# Patient Record
Sex: Male | Born: 1995 | Race: White | Hispanic: No | Marital: Single | Smoking: Current every day smoker
Health system: Southern US, Community
[De-identification: ages and names within clinical notes are randomized; demographics above are authoritative.]

## PROBLEM LIST (undated history)

## (undated) DIAGNOSIS — F909 Attention-deficit hyperactivity disorder, unspecified type: Secondary | ICD-10-CM

## (undated) DIAGNOSIS — F32A Depression, unspecified: Secondary | ICD-10-CM

## (undated) DIAGNOSIS — F419 Anxiety disorder, unspecified: Secondary | ICD-10-CM

## (undated) DIAGNOSIS — G2581 Restless legs syndrome: Secondary | ICD-10-CM

## (undated) DIAGNOSIS — F329 Major depressive disorder, single episode, unspecified: Secondary | ICD-10-CM

## (undated) HISTORY — PX: TESTICLE SURGERY: SHX794

## (undated) HISTORY — PX: REPLACEMENT TOTAL KNEE: SUR1224

---
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
## 2014-04-26 ENCOUNTER — Encounter: Payer: Self-pay | Admitting: Family Medicine

## 2017-08-24 ENCOUNTER — Other Ambulatory Visit: Payer: Self-pay

## 2017-08-24 ENCOUNTER — Encounter (HOSPITAL_COMMUNITY): Payer: Self-pay | Admitting: Emergency Medicine

## 2017-08-24 ENCOUNTER — Inpatient Hospital Stay (HOSPITAL_COMMUNITY)
Admission: AD | Admit: 2017-08-24 | Discharge: 2017-08-27 | DRG: 881 | Disposition: A | Discharge status: Home / Self Care | Payer: Federal, State, Local not specified - Other | Source: Intra-hospital | Attending: Psychiatry | Admitting: Psychiatry

## 2017-08-24 ENCOUNTER — Emergency Department (HOSPITAL_COMMUNITY)
Admission: EM | Admit: 2017-08-24 | Discharge: 2017-08-24 | Disposition: A | Discharge status: Other Acute Inpatient Hospital | Payer: Self-pay | Attending: Emergency Medicine | Admitting: Emergency Medicine

## 2017-08-24 ENCOUNTER — Encounter (HOSPITAL_COMMUNITY): Payer: Self-pay | Admitting: *Deleted

## 2017-08-24 DIAGNOSIS — F329 Major depressive disorder, single episode, unspecified: Secondary | ICD-10-CM | POA: Insufficient documentation

## 2017-08-24 DIAGNOSIS — F322 Major depressive disorder, single episode, severe without psychotic features: Secondary | ICD-10-CM | POA: Diagnosis not present

## 2017-08-24 DIAGNOSIS — F1310 Sedative, hypnotic or anxiolytic abuse, uncomplicated: Secondary | ICD-10-CM | POA: Diagnosis present

## 2017-08-24 DIAGNOSIS — R45851 Suicidal ideations: Secondary | ICD-10-CM | POA: Insufficient documentation

## 2017-08-24 DIAGNOSIS — Z046 Encounter for general psychiatric examination, requested by authority: Secondary | ICD-10-CM | POA: Insufficient documentation

## 2017-08-24 DIAGNOSIS — F17210 Nicotine dependence, cigarettes, uncomplicated: Secondary | ICD-10-CM | POA: Diagnosis present

## 2017-08-24 DIAGNOSIS — F1120 Opioid dependence, uncomplicated: Secondary | ICD-10-CM | POA: Diagnosis present

## 2017-08-24 DIAGNOSIS — G4700 Insomnia, unspecified: Secondary | ICD-10-CM | POA: Diagnosis present

## 2017-08-24 DIAGNOSIS — Z96659 Presence of unspecified artificial knee joint: Secondary | ICD-10-CM | POA: Diagnosis present

## 2017-08-24 DIAGNOSIS — F1290 Cannabis use, unspecified, uncomplicated: Secondary | ICD-10-CM | POA: Diagnosis present

## 2017-08-24 DIAGNOSIS — F1994 Other psychoactive substance use, unspecified with psychoactive substance-induced mood disorder: Secondary | ICD-10-CM | POA: Diagnosis present

## 2017-08-24 DIAGNOSIS — F419 Anxiety disorder, unspecified: Secondary | ICD-10-CM | POA: Diagnosis present

## 2017-08-24 DIAGNOSIS — F1410 Cocaine abuse, uncomplicated: Secondary | ICD-10-CM | POA: Diagnosis not present

## 2017-08-24 DIAGNOSIS — F909 Attention-deficit hyperactivity disorder, unspecified type: Secondary | ICD-10-CM | POA: Diagnosis present

## 2017-08-24 DIAGNOSIS — Z62819 Personal history of unspecified abuse in childhood: Secondary | ICD-10-CM | POA: Diagnosis present

## 2017-08-24 DIAGNOSIS — F1910 Other psychoactive substance abuse, uncomplicated: Secondary | ICD-10-CM | POA: Insufficient documentation

## 2017-08-24 DIAGNOSIS — F17200 Nicotine dependence, unspecified, uncomplicated: Secondary | ICD-10-CM | POA: Insufficient documentation

## 2017-08-24 DIAGNOSIS — G2581 Restless legs syndrome: Secondary | ICD-10-CM | POA: Diagnosis present

## 2017-08-24 HISTORY — DX: Attention-deficit hyperactivity disorder, unspecified type: F90.9

## 2017-08-24 HISTORY — DX: Major depressive disorder, single episode, unspecified: F32.9

## 2017-08-24 HISTORY — DX: Depression, unspecified: F32.A

## 2017-08-24 HISTORY — DX: Anxiety disorder, unspecified: F41.9

## 2017-08-24 HISTORY — DX: Restless legs syndrome: G25.81

## 2017-08-24 LAB — CBC WITH DIFFERENTIAL/PLATELET
BASOS ABS: 0.1 10*3/uL (ref 0.0–0.1)
Basophils Relative: 1 %
EOS PCT: 2 %
Eosinophils Absolute: 0.1 10*3/uL (ref 0.0–0.7)
HCT: 40.8 % (ref 39.0–52.0)
Hemoglobin: 13.9 g/dL (ref 13.0–17.0)
LYMPHS ABS: 1.4 10*3/uL (ref 0.7–4.0)
Lymphocytes Relative: 18 %
MCH: 31.7 pg (ref 26.0–34.0)
MCHC: 34.1 g/dL (ref 30.0–36.0)
MCV: 92.9 fL (ref 78.0–100.0)
MONO ABS: 0.4 10*3/uL (ref 0.1–1.0)
Monocytes Relative: 5 %
Neutro Abs: 5.6 10*3/uL (ref 1.7–7.7)
Neutrophils Relative %: 74 %
PLATELETS: 261 10*3/uL (ref 150–400)
RBC: 4.39 MIL/uL (ref 4.22–5.81)
RDW: 12.4 % (ref 11.5–15.5)
WBC: 7.6 10*3/uL (ref 4.0–10.5)

## 2017-08-24 LAB — RAPID URINE DRUG SCREEN, HOSP PERFORMED
Amphetamines: NOT DETECTED
BENZODIAZEPINES: POSITIVE — AB
Cocaine: POSITIVE — AB
OPIATES: NOT DETECTED
Tetrahydrocannabinol: POSITIVE — AB

## 2017-08-24 LAB — COMPREHENSIVE METABOLIC PANEL
ALT: 14 U/L (ref 0–44)
AST: 16 U/L (ref 15–41)
Albumin: 4 g/dL (ref 3.5–5.0)
Alkaline Phosphatase: 71 U/L (ref 38–126)
Anion gap: 7 (ref 5–15)
BILIRUBIN TOTAL: 0.7 mg/dL (ref 0.3–1.2)
BUN: 17 mg/dL (ref 6–20)
CO2: 27 mmol/L (ref 22–32)
CREATININE: 1.21 mg/dL (ref 0.61–1.24)
Calcium: 8.9 mg/dL (ref 8.9–10.3)
Chloride: 101 mmol/L (ref 98–111)
GFR calc non Af Amer: >60 mL/min (ref >60)
Glucose, Bld: 95 mg/dL (ref 70–99)
Potassium: 4 mmol/L (ref 3.5–5.1)
Sodium: 135 mmol/L (ref 135–145)
TOTAL PROTEIN: 7.4 g/dL (ref 6.5–8.1)

## 2017-08-24 LAB — ETHANOL

## 2017-08-24 MED ORDER — CHLORDIAZEPOXIDE HCL 25 MG PO CAPS: 25.0000 mg | ORAL_CAPSULE | Freq: Four times a day (QID) | ORAL | Status: DC | PRN

## 2017-08-24 MED ORDER — LOPERAMIDE HCL 2 MG PO CAPS: 2.0000 mg | ORAL_CAPSULE | ORAL | Status: DC | PRN

## 2017-08-24 MED ORDER — DICYCLOMINE HCL 10 MG PO CAPS: 20.0000 mg | ORAL_CAPSULE | Freq: Four times a day (QID) | ORAL | Status: DC | PRN

## 2017-08-24 MED ORDER — HYDROXYZINE HCL 25 MG PO TABS
25.0000 mg | ORAL_TABLET | Freq: Four times a day (QID) | ORAL | Status: DC | PRN
  Filled 2017-08-24: qty 10

## 2017-08-24 MED ORDER — MAGNESIUM HYDROXIDE 400 MG/5ML PO SUSP: 30.0000 mL | Freq: Every day | ORAL | Status: DC | PRN

## 2017-08-24 MED ORDER — ONDANSETRON 4 MG PO TBDP
4.0000 mg | ORAL_TABLET | Freq: Four times a day (QID) | ORAL | Status: DC | PRN
  Administered 2017-08-24: 4 mg via ORAL
  Filled 2017-08-24: qty 1

## 2017-08-24 MED ORDER — CLONIDINE HCL 0.1 MG PO TABS: 0.1000 mg | ORAL_TABLET | Freq: Every day | ORAL | Status: DC

## 2017-08-24 MED ORDER — CLONIDINE HCL 0.1 MG PO TABS
0.1000 mg | ORAL_TABLET | ORAL | Status: DC
  Filled 2017-08-24 (×2): qty 1

## 2017-08-24 MED ORDER — HYDROXYZINE HCL 25 MG PO TABS: 25.0000 mg | ORAL_TABLET | Freq: Four times a day (QID) | ORAL | Status: DC | PRN

## 2017-08-24 MED ORDER — DICYCLOMINE HCL 20 MG PO TABS
20.0000 mg | ORAL_TABLET | Freq: Four times a day (QID) | ORAL | Status: DC | PRN
  Administered 2017-08-24: 20 mg via ORAL
  Filled 2017-08-24: qty 1

## 2017-08-24 MED ORDER — ACETAMINOPHEN 325 MG PO TABS: 650.0000 mg | ORAL_TABLET | Freq: Four times a day (QID) | ORAL | Status: DC | PRN

## 2017-08-24 MED ORDER — ALUM & MAG HYDROXIDE-SIMETH 200-200-20 MG/5ML PO SUSP: 30.0000 mL | ORAL | Status: DC | PRN

## 2017-08-24 MED ORDER — CLONIDINE HCL 0.1 MG PO TABS
0.1000 mg | ORAL_TABLET | Freq: Four times a day (QID) | ORAL | Status: DC
  Administered 2017-08-24 (×2): 0.1 mg via ORAL
  Filled 2017-08-24 (×2): qty 1

## 2017-08-24 MED ORDER — TRAZODONE HCL 100 MG PO TABS
100.0000 mg | ORAL_TABLET | Freq: Every evening | ORAL | Status: DC | PRN
  Filled 2017-08-24: qty 1

## 2017-08-24 MED ORDER — NAPROXEN 250 MG PO TABS: 500.0000 mg | ORAL_TABLET | Freq: Two times a day (BID) | ORAL | Status: DC | PRN

## 2017-08-24 MED ORDER — METHOCARBAMOL 500 MG PO TABS
500.0000 mg | ORAL_TABLET | Freq: Three times a day (TID) | ORAL | Status: DC | PRN
  Administered 2017-08-24 – 2017-08-25 (×2): 500 mg via ORAL
  Filled 2017-08-24 (×2): qty 1

## 2017-08-24 MED ORDER — CHLORDIAZEPOXIDE HCL 25 MG PO CAPS
ORAL_CAPSULE | ORAL | Status: AC
  Administered 2017-08-24: 23:00:00
  Filled 2017-08-24: qty 1

## 2017-08-24 MED ORDER — CLONIDINE HCL 0.1 MG PO TABS
0.1000 mg | ORAL_TABLET | Freq: Four times a day (QID) | ORAL | Status: AC
  Administered 2017-08-25 – 2017-08-27 (×8): 0.1 mg via ORAL
  Filled 2017-08-24 (×10): qty 1

## 2017-08-24 MED ORDER — TRAZODONE HCL 100 MG PO TABS
100.0000 mg | ORAL_TABLET | Freq: Every evening | ORAL | Status: DC | PRN
  Administered 2017-08-24 – 2017-08-26 (×5): 100 mg via ORAL
  Filled 2017-08-24 (×6): qty 1

## 2017-08-24 MED ORDER — NAPROXEN 500 MG PO TABS: 500.0000 mg | ORAL_TABLET | Freq: Two times a day (BID) | ORAL | Status: DC | PRN

## 2017-08-24 MED ORDER — CLONIDINE HCL 0.1 MG PO TABS: 0.1000 mg | ORAL_TABLET | Freq: Two times a day (BID) | ORAL | Status: DC

## 2017-08-24 MED ORDER — ONDANSETRON 4 MG PO TBDP: 4.0000 mg | ORAL_TABLET | Freq: Four times a day (QID) | ORAL | Status: DC | PRN

## 2017-08-24 MED ORDER — METHOCARBAMOL 500 MG PO TABS: 500.0000 mg | ORAL_TABLET | Freq: Three times a day (TID) | ORAL | Status: DC | PRN

## 2017-08-24 MED ORDER — PNEUMOCOCCAL VAC POLYVALENT 25 MCG/0.5ML IJ INJ: 0.5000 mL | INJECTION | INTRAMUSCULAR | Status: DC

## 2017-08-24 NOTE — ED Notes (Signed)
Mardene CelesteJoanna RN [facility] called and advised that pt has been accepted at their facility, room 304-1 by Dr Jola Babinskilary and can be transferred to arrive at 10pm,

## 2017-08-24 NOTE — ED Triage Notes (Signed)
Pt states having a heroin, xanax and marijuana problem.  Last use was yesterday.  Pt states having suicidal thoughts x 3 weeks. Pt was trying to get into a rehab/detox facility but not able to due to no insurance. Pt has been sending family members texts messages stating "I just want to kill myself by overdosing"

## 2017-08-24 NOTE — ED Notes (Signed)
Pt unable to sign esignature upon leaving due to chart being open by someone else.
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
## 2017-08-24 NOTE — ED Notes (Signed)
Meal given

## 2017-08-24 NOTE — ED Notes (Signed)
Gave pt mobile phone for 5 minute phone call.

## 2017-08-24 NOTE — ED Notes (Signed)
Pt requested nicotine patch, unable to obtain prior to transfer
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
## 2017-08-24 NOTE — Tx Team (Signed)
Initial Treatment Plan 08/24/2017 11:54 PM Jack FearDavid L Sansone [facility]:[id number]    PATIENT STRESSORS: Financial difficulties Marital or family conflict Substance abuse   PATIENT STRENGTHS: Active sense of humor Average or above average intelligence Communication skills Motivation for treatment/growth Supportive family/friends   PATIENT IDENTIFIED PROBLEMS: Substance abuse  Depression  Suicidal Ideation    "Getting sober"  "Be the best dad that I can"           DISCHARGE CRITERIA:  Adequate post-discharge living arrangements Improved stabilization in mood, thinking, and/or behavior Motivation to continue treatment in a less acute level of care Need for constant or close observation no longer present Verbal commitment to aftercare and medication compliance Withdrawal symptoms are absent or subacute and managed without 24-hour nursing intervention  PRELIMINARY DISCHARGE PLAN: Attend 12-step recovery group Outpatient therapy Return to previous living arrangement  PATIENT/FAMILY INVOLVEMENT: This treatment plan has been presented to and reviewed with the patient, Jack Mason.  The patient and family have been given the opportunity to ask questions and make suggestions.  Jack Mason, Jack Cease Elizabeth, RN 08/24/2017, 11:54 PM
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
## 2017-08-25 DIAGNOSIS — F1310 Sedative, hypnotic or anxiolytic abuse, uncomplicated: Secondary | ICD-10-CM | POA: Diagnosis present

## 2017-08-25 DIAGNOSIS — F1410 Cocaine abuse, uncomplicated: Secondary | ICD-10-CM | POA: Diagnosis present

## 2017-08-25 DIAGNOSIS — F322 Major depressive disorder, single episode, severe without psychotic features: Secondary | ICD-10-CM

## 2017-08-25 MED ORDER — LORAZEPAM 1 MG PO TABS
1.0000 mg | ORAL_TABLET | Freq: Three times a day (TID) | ORAL | Status: DC
  Administered 2017-08-26 (×2): 1 mg via ORAL
  Filled 2017-08-25 (×2): qty 1

## 2017-08-25 MED ORDER — VENLAFAXINE HCL ER 37.5 MG PO CP24
37.5000 mg | ORAL_CAPSULE | Freq: Every day | ORAL | Status: DC
  Administered 2017-08-25 – 2017-08-27 (×3): 37.5 mg via ORAL
  Filled 2017-08-25 (×5): qty 1

## 2017-08-25 MED ORDER — VITAMIN B-1 100 MG PO TABS
100.0000 mg | ORAL_TABLET | Freq: Every day | ORAL | Status: DC
  Administered 2017-08-26 – 2017-08-27 (×2): 100 mg via ORAL
  Filled 2017-08-25 (×4): qty 1

## 2017-08-25 MED ORDER — ADULT MULTIVITAMIN W/MINERALS CH
1.0000 | ORAL_TABLET | Freq: Every day | ORAL | Status: DC
  Administered 2017-08-25 – 2017-08-27 (×3): 1 via ORAL
  Filled 2017-08-25 (×4): qty 1

## 2017-08-25 MED ORDER — LORAZEPAM 1 MG PO TABS: 1.0000 mg | ORAL_TABLET | Freq: Every day | ORAL | Status: DC

## 2017-08-25 MED ORDER — NICOTINE 21 MG/24HR TD PT24
21.0000 mg | MEDICATED_PATCH | Freq: Every day | TRANSDERMAL | Status: DC
  Administered 2017-08-25 – 2017-08-27 (×3): 21 mg via TRANSDERMAL
  Filled 2017-08-25 (×4): qty 1

## 2017-08-25 MED ORDER — LORAZEPAM 1 MG PO TABS
1.0000 mg | ORAL_TABLET | Freq: Four times a day (QID) | ORAL | Status: AC
  Administered 2017-08-25 (×4): 1 mg via ORAL
  Filled 2017-08-25 (×4): qty 1

## 2017-08-25 MED ORDER — LORAZEPAM 1 MG PO TABS: 1.0000 mg | ORAL_TABLET | Freq: Two times a day (BID) | ORAL | Status: DC

## 2017-08-25 MED ORDER — THIAMINE HCL 100 MG/ML IJ SOLN
100.0000 mg | Freq: Once | INTRAMUSCULAR | Status: AC
  Administered 2017-08-25: 100 mg via INTRAMUSCULAR
  Filled 2017-08-25: qty 2

## 2017-08-25 NOTE — Plan of Care (Signed)
Nurse discussed depression, anxiety, coping skills with patient.  
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
## 2017-08-25 NOTE — Progress Notes (Signed)
D:  Patient's self inventory sheet, patient has fair sleep, sleep medication helpful.  Good appetite, low energy level, good concentration.  Rated depression and anxiety 5, hopeless 1.  Withdrawals, chilling, irritability.  Denied SI.  Denied physical problems.  Denied physical pain.  Goal is get through the day sober.  Plans to stay clean.  That I am very appreciative.  No discharge plans. A:  Medications administered per MD orders.  Emotional support and encouragement given patient. R:  Denied SI and HI, contracts for safety.   Denied A/V hallucinations.  Safety maintained with 15 minute checks.

## 2017-08-25 NOTE — [facility] Group Notes (Signed)
[facility] Group Notes: (Clinical Social Work)   08/25/2017      Type of Therapy:  Group Therapy   Participation Level:  Did Not Attend despite MHT prompting   Ambrose MantleMareida Grossman-Orr, LCSW 08/25/2017, 12:44 PM

## 2017-08-25 NOTE — [facility] Suicide Risk Assessment (Signed)
[facility] INPATIENT:  Family/Significant Other Suicide Prevention Education  Suicide Prevention Education:  Patient Refusal for Family/Significant Other Suicide Prevention Education: The patient Jack Mason has refused to provide written consent for family/significant other to be provided Family/Significant Other Suicide Prevention Education during admission and/or prior to discharge.  Physician notified.  Suicide Prevention Education was reviewed thoroughly with patient, including risk factors, warning signs, and what to do.  Mobile Crisis services were described and that telephone number pointed out, with encouragement to patient to put this number in personal cell phone.  Brochure was provided to patient to share with natural supports.  Patient acknowledged the ways in which they are at risk, and how working through each of their issues can gradually start to reduce their risk factors.  Patient was encouraged to think of the information in the context of people in their own lives.  Patient denied having access to firearms  Patient verbalized understanding of information provided.  Patient endorsed a desire to live.      Jack Mason 08/25/2017, 2:57 PM

## 2017-08-25 NOTE — Progress Notes (Signed)
The patient attended the evening A.A.meeting and was appropriate.  
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
## 2017-08-25 NOTE — Progress Notes (Signed)
D.  Pt pleasant on approach, denies withdrawal at this time.  Pt was positive for evening AA group, observed engaged in appropriate interaction with peers on unit.  Pt denies SI/HI/AVH a this time.  Pt is hopeful for discharge tomorrow.  A.  Support and encouragement offered, medication given as ordered  R. Pt remains safe on the unit, will continue to monitor.

## 2017-08-26 DIAGNOSIS — F1994 Other psychoactive substance use, unspecified with psychoactive substance-induced mood disorder: Secondary | ICD-10-CM

## 2017-08-26 DIAGNOSIS — F1410 Cocaine abuse, uncomplicated: Secondary | ICD-10-CM

## 2017-08-26 DIAGNOSIS — F1310 Sedative, hypnotic or anxiolytic abuse, uncomplicated: Secondary | ICD-10-CM

## 2017-08-26 NOTE — Progress Notes (Signed)
Patient feels he is ready to be discharged Tuesday 08/27/2017.

## 2017-08-26 NOTE — Plan of Care (Signed)
Nurse discussed depression, anxiety, coping skills with patient.  

## 2017-08-26 NOTE — Progress Notes (Signed)
D:  Patient's self inventory sheet, patient sleeps good, sleep medication helpful.  Good appetite, normal energy level, good concentration.  Denied depression, hopeless and anxiety.  Denied withdrawals.  Happy mind!  Denied SI.  Denied physical problems.  Denied physical pain.  Plans to stay motivated and continue groups/meetings.  Attend and participate.  "Thank you so much.  My life has changed already."  Does have discharge plans. A:  Medications administered per MD orders.  Emotional support and encouragement given patient. R:  Denied SI and HI, contracts for safety.  Denied A/V hallucinations.  Safety maintained with 15 minute checks.

## 2017-08-26 NOTE — Tx Team (Signed)
Interdisciplinary Treatment and Diagnostic Plan Update  08/26/2017 Time of Session: [id number] Jack Mason MRN: [id number]  Principal Diagnosis: MDD (major depressive disorder), severe (HCC)  Secondary Diagnoses: Principal Problem:   MDD (major depressive disorder), severe (HCC) Active Problems:   Substance induced mood disorder (HCC)   Benzodiazepine abuse (HCC)   Cocaine abuse (HCC)   Current Medications:  Current Facility-Administered Medications  Medication Dose Route Frequency Provider Last Rate Last Dose  . acetaminophen (TYLENOL) tablet 650 mg  650 mg Oral Q6H PRN Kerry Hough, PA-C      . alum & mag hydroxide-simeth (MAALOX/MYLANTA) 200-200-20 MG/5ML suspension 30 mL  30 mL Oral Q4H PRN Kerry Hough, PA-C      . chlordiazePOXIDE (LIBRIUM) capsule 25 mg  25 mg Oral Q6H PRN Kerry Hough, PA-C      . cloNIDine (CATAPRES) tablet 0.1 mg  0.1 mg Oral QID Donell Sievert E, PA-C   0.1 mg at 08/25/17 2120   Followed by  . [START ON 08/27/2017] cloNIDine (CATAPRES) tablet 0.1 mg  0.1 mg Oral BH-qamhs Simon, Spencer E, PA-C       Followed by  . [START ON 08/30/2017] cloNIDine (CATAPRES) tablet 0.1 mg  0.1 mg Oral QAC breakfast Kerry Hough, PA-C      . dicyclomine (BENTYL) tablet 20 mg  20 mg Oral Q6H PRN Kerry Hough, PA-C   20 mg at 08/24/17 2321  . hydrOXYzine (ATARAX/VISTARIL) tablet 25 mg  25 mg Oral Q6H PRN Donell Sievert E, PA-C      . loperamide (IMODIUM) capsule 2-4 mg  2-4 mg Oral PRN Kerry Hough, PA-C      . LORazepam (ATIVAN) tablet 1 mg  1 mg Oral TID Antonieta Pert, MD       Followed by  . [START ON 08/27/2017] LORazepam (ATIVAN) tablet 1 mg  1 mg Oral BID Antonieta Pert, MD       Followed by  . [START ON 08/28/2017] LORazepam (ATIVAN) tablet 1 mg  1 mg Oral Daily Antonieta Pert, MD      . magnesium hydroxide (MILK OF MAGNESIA) suspension 30 mL  30 mL Oral Daily PRN Kerry Hough, PA-C      . methocarbamol (ROBAXIN) tablet 500 mg  500 mg  Oral Q8H PRN Donell Sievert E, PA-C   500 mg at 08/25/17 2120  . multivitamin with minerals tablet 1 tablet  1 tablet Oral Daily Antonieta Pert, MD   1 tablet at 08/25/17 1022  . naproxen (NAPROSYN) tablet 500 mg  500 mg Oral BID PRN Kerry Hough, PA-C      . nicotine (NICODERM CQ - dosed in mg/24 hours) patch 21 mg  21 mg Transdermal Daily Cobos, Rockey Situ, MD   21 mg at 08/25/17 0914  . ondansetron (ZOFRAN-ODT) disintegrating tablet 4 mg  4 mg Oral Q6H PRN Kerry Hough, PA-C   4 mg at 08/24/17 2321  . pneumococcal 23 valent vaccine (PNU-IMMUNE) injection 0.5 mL  0.5 mL Intramuscular Tomorrow-1000 Cobos, Fernando A, MD      . thiamine (VITAMIN B-1) tablet 100 mg  100 mg Oral Daily Antonieta Pert, MD      . traZODone (DESYREL) tablet 100 mg  100 mg Oral QHS,MR X 1 Kerry Hough, PA-C   100 mg at 08/25/17 2218  . venlafaxine XR (EFFEXOR-XR) 24 hr capsule 37.5 mg  37.5 mg Oral Daily Money, Gerlene Burdock, FNP   37.5  mg at 08/25/17 1021   PTA Medications: No medications prior to admission.    Patient Stressors: Financial difficulties Marital or family conflict Substance abuse  Patient Strengths: Active sense of humor Average or above average intelligence Communication skills Motivation for treatment/growth Supportive family/friends  Treatment Modalities: Medication Management, Group therapy, Case management,  1 to 1 session with clinician, Psychoeducation, Recreational therapy.   Physician Treatment Plan for Primary Diagnosis: MDD (major depressive disorder), severe (HCC) Long Term Goal(s): Improvement in symptoms so as ready for discharge Improvement in symptoms so as ready for discharge   Short Term Goals: Ability to verbalize feelings will improve Ability to disclose and discuss suicidal ideas Ability to identify triggers associated with substance abuse/mental health issues will improve Ability to identify and develop effective coping behaviors will improve Ability  to maintain clinical measurements within normal limits will improve Compliance with prescribed medications will improve Ability to identify triggers associated with substance abuse/mental health issues will improve  Medication Management: Evaluate patient's response, side effects, and tolerance of medication regimen.  Therapeutic Interventions: 1 to 1 sessions, Unit Group sessions and Medication administration.  Evaluation of Outcomes: Progressing  Physician Treatment Plan for Secondary Diagnosis: Principal Problem:   MDD (major depressive disorder), severe (HCC) Active Problems:   Substance induced mood disorder (HCC)   Benzodiazepine abuse (HCC)   Cocaine abuse (HCC)  Long Term Goal(s): Improvement in symptoms so as ready for discharge Improvement in symptoms so as ready for discharge   Short Term Goals: Ability to verbalize feelings will improve Ability to disclose and discuss suicidal ideas Ability to identify triggers associated with substance abuse/mental health issues will improve Ability to identify and develop effective coping behaviors will improve Ability to maintain clinical measurements within normal limits will improve Compliance with prescribed medications will improve Ability to identify triggers associated with substance abuse/mental health issues will improve     Medication Management: Evaluate patient's response, side effects, and tolerance of medication regimen.  Therapeutic Interventions: 1 to 1 sessions, Unit Group sessions and Medication administration.  Evaluation of Outcomes: Progressing   RN Treatment Plan for Primary Diagnosis: MDD (major depressive disorder), severe (HCC) Long Term Goal(s): Knowledge of disease and therapeutic regimen to maintain health will improve  Short Term Goals: Ability to remain free from injury will improve, Ability to verbalize frustration and anger appropriately will improve, Ability to demonstrate self-control, Ability to  disclose and discuss suicidal ideas and Ability to identify and develop effective coping behaviors will improve  Medication Management: RN will administer medications as ordered by provider, will assess and evaluate patient's response and provide education to patient for prescribed medication. RN will report any adverse and/or side effects to prescribing provider.  Therapeutic Interventions: 1 on 1 counseling sessions, Psychoeducation, Medication administration, Evaluate responses to treatment, Monitor vital signs and CBGs as ordered, Perform/monitor CIWA, COWS, AIMS and Fall Risk screenings as ordered, Perform wound care treatments as ordered.  Evaluation of Outcomes: Progressing   LCSW Treatment Plan for Primary Diagnosis: MDD (major depressive disorder), severe (HCC) Long Term Goal(s): Safe transition to appropriate next level of care at discharge, Engage patient in therapeutic group addressing interpersonal concerns.  Short Term Goals: Engage patient in aftercare planning with referrals and resources, Facilitate patient progression through stages of change regarding substance use diagnoses and concerns, Identify triggers associated with mental health/substance abuse issues and Increase skills for wellness and recovery  Therapeutic Interventions: Assess for all discharge needs, 1 to 1 time with Social worker, Explore available resources  and support systems, Assess for adequacy in community support network, Educate family and significant other(s) on suicide prevention, Complete Psychosocial Assessment, Interpersonal group therapy.  Evaluation of Outcomes: Progressing   Progress in Treatment: Attending groups: Yes. Participating in groups: Yes. Taking medication as prescribed: Yes. Toleration medication: Yes. Family/Significant other contact made: SPE completed with pt; pt declined to consent to collateral contact.  Patient understands diagnosis: Yes. Discussing patient identified  problems/goals with staff: Yes. Medical problems stabilized or resolved: Yes. Denies suicidal/homicidal ideation: Yes. Issues/concerns per patient self-inventory: No. Other: n/a   New problem(s) identified: No, Describe:  n/a  New Short Term/Long Term Goal(s): detox, medication management for mood stabilization; elimination of SI thoughts; development of comprehensive mental wellness/sobriety plan.   Patient Goals:  "To get sober and be the best father that I can."   Discharge Plan or Barriers: CSW assessing. Pt interested in [facility] and plans to return home at discharge. Pt is concerned about court date on 9/9. [facility] pamphlet, Mobile Crisis information, and AA/NA information provided to patient for additional community support and resources.   Reason for Continuation of Hospitalization: Anxiety Depression Medication stabilization Suicidal ideation Withdrawal symptoms  Estimated Length of Stay: Wed, 08/28/17  Attendees: Patient:  08/26/2017 8:41 AM  Physician:  Marguerita Merles MD; Dr. Viviano Simas MD 08/26/2017 8:41 AM  Nursing: Meriam Sprague RN; Casimiro Needle RN 08/26/2017 8:41 AM  RN Care Manager:x 08/26/2017 8:41 AM  Social Worker: Corrie Mckusick LCSW 08/26/2017 8:41 AM  Recreational Therapist: x 08/26/2017 8:41 AM  Other: x 08/26/2017 8:41 AM  Other:  08/26/2017 8:41 AM  Other: 08/26/2017 8:41 AM    Scribe for Treatment Team: Rona Ravens, LCSW 08/26/2017 8:41 AM

## 2017-08-26 NOTE — [facility] Group Notes (Signed)
LCSW Group Therapy Note   08/26/2017 1:15pm   Type of Therapy and Topic:  Group Therapy:  Overcoming Obstacles   Participation Level:  Did Not Attend--pt invited. Chose to remain in bed.    Description of Group:    In this group patients will be encouraged to explore what they see as obstacles to their own wellness and recovery. They will be guided to discuss their thoughts, feelings, and behaviors related to these obstacles. The group will process together ways to cope with barriers, with attention given to specific choices patients can make. Each patient will be challenged to identify changes they are motivated to make in order to overcome their obstacles. This group will be process-oriented, with patients participating in exploration of their own experiences as well as giving and receiving support and challenge from other group members.   Therapeutic Goals: 1. Patient will identify personal and current obstacles as they relate to admission. 2. Patient will identify barriers that currently interfere with their wellness or overcoming obstacles.  3. Patient will identify feelings, thought process and behaviors related to these barriers. 4. Patient will identify two changes they are willing to make to overcome these obstacles:      Summary of Patient Progress   x   Therapeutic Modalities:   Cognitive Behavioral Therapy Solution Focused Therapy Motivational Interviewing Relapse Prevention Therapy  Rona RavensHeather S Shawan Corella, LCSW 08/26/2017 12:03 PM

## 2017-08-26 NOTE — Progress Notes (Signed)
[facility] MD Progress Note  08/26/2017 12:00 PM Jack Mason  MRN:  [id number]  Subjective: Jack Mason reports, "I came here for heroin & Benzodiazepine detox. I think I'm doing well now. The withdrawal symptoms are getting less & less. The anxiety is not that bad any more. I'm sleeping well & have been going to groups. I would like to be referred to the [facility] for follow-up care visit because it is close to my home".   Patient is a 22 year old male who presented to the [facility] emergency department on 08/24/2017 for assistance with his substance abuse issues.  He stated that he has been trying to get help for his addiction.  He stated he had been unable to get treatment because he has no insurance.  He had been frustrated over this, and texted his family that he was thinking about overdosing on drugs.  Patient stated he was tired of living his way and that he wanted to change his life.  Patient recently admitted he got 3 DUIs in 1 week because he was driving around intoxicated on drugs.  He stated that he had gotten hooked on opiates after being treated for orthopedic injuries.  He also admitted that he had used Xanax, cocaine and marijuana.    He denied any previous detox or rehabilitation hospitalizations.  He also admitted to depression symptoms.  He was admitted to the hospital for evaluation and stabilization.  Jack Mason is seen, chart reviewed. The chart finding discussed with the treatment team. He presents alert, oriented & aware of situation. He is visible on the unit, attending group sessions. He presents with a good affect & eye contact. He says his treatment is going well. He is tolerating his treatment regimen. He denies any adverse effects. He currently denies any SIHI, AVH, delusional thoughts or paranoia. He does not appear to be responding to any internal stimuli. Jack Mason has agreed to continue current plan of care already in progress.   Principal Problem: MDD (major depressive disorder), severe  ([place])  Diagnosis:   Patient Active Problem List   Diagnosis Date Noted  . MDD (major depressive disorder), severe ([place]) [F32.2] 08/25/2017  . Benzodiazepine abuse ([place]) [F13.10] 08/25/2017  . Cocaine abuse ([place]) [F14.10] 08/25/2017  . Substance induced mood disorder ([place]) [F19.94] 08/24/2017  . Patellar dislocation [S83.006A] 06/14/2011  . Fibula fracture [S82.409A] 10/16/2010   Total Time spent with patient: 25 minutes  Past Psychiatric History: See H&P  Past Medical History:  Past Medical History:  Diagnosis Date  . ADHD   . Anxiety   . Depression   . Restless legs syndrome (RLS)     Past Surgical History:  Procedure Laterality Date  . REPLACEMENT TOTAL KNEE    . TESTICLE SURGERY     Family History:  Family History  Problem Relation Age of Onset  . Heart disease Unknown   . Cancer Unknown   . Diabetes Unknown    Family Psychiatric  History: See H&P  Social History:  Social History   Substance and Sexual Activity  Alcohol Use No     Social History   Substance and Sexual Activity  Drug Use Yes  . Types: Marijuana, IV, Methamphetamines, Benzodiazepines, Heroin   Comment: xanax    Social History   Socioeconomic History  . Marital status: Single    Spouse name: Not on file  . Number of children: Not on file  . Years of education: Not on file  . Highest education level: Not on file  Occupational History  . Occupation: Ship broker  Social Needs  . Financial resource strain: Not on file  . Food insecurity:    Worry: Not on file    Inability: Not on file  . Transportation needs:    Medical: Not on file    Non-medical: Not on file  Tobacco Use  . Smoking status: Current Every Day Smoker    Packs/day: 1.50    Types: Cigarettes  . Smokeless tobacco: Never Used  Substance and Sexual Activity  . Alcohol use: No  . Drug use: Yes    Types: Marijuana, IV, Methamphetamines, Benzodiazepines, Heroin    Comment: xanax  . Sexual activity: Yes    Birth  control/protection: None  Lifestyle  . Physical activity:    Days per week: Not on file    Minutes per session: Not on file  . Stress: Not on file  Relationships  . Social connections:    Talks on phone: Not on file    Gets together: Not on file    Attends religious service: Not on file    Active member of club or organization: Not on file    Attends meetings of clubs or organizations: Not on file    Relationship status: Not on file  Other Topics Concern  . Not on file  Social History Narrative  . Not on file   Additional Social History:   Sleep: Good  Appetite:  Good  Current Medications: Current Facility-Administered Medications  Medication Dose Route Frequency Provider Last Rate Last Dose  . acetaminophen (TYLENOL) tablet 650 mg  650 mg Oral Q6H PRN Laverle Hobby, PA-C      . alum & mag hydroxide-simeth (MAALOX/MYLANTA) 200-200-20 MG/5ML suspension 30 mL  30 mL Oral Q4H PRN Laverle Hobby, PA-C      . chlordiazePOXIDE (LIBRIUM) capsule 25 mg  25 mg Oral Q6H PRN Laverle Hobby, PA-C      . cloNIDine (CATAPRES) tablet 0.1 mg  0.1 mg Oral QID Patriciaann Clan E, PA-C   0.1 mg at 08/26/17 6789   Followed by  . [START ON 08/27/2017] cloNIDine (CATAPRES) tablet 0.1 mg  0.1 mg Oral BH-qamhs Simon, Spencer E, PA-C       Followed by  . [START ON 08/30/2017] cloNIDine (CATAPRES) tablet 0.1 mg  0.1 mg Oral QAC breakfast Laverle Hobby, PA-C      . dicyclomine (BENTYL) tablet 20 mg  20 mg Oral Q6H PRN Laverle Hobby, PA-C   20 mg at 08/24/17 2321  . hydrOXYzine (ATARAX/VISTARIL) tablet 25 mg  25 mg Oral Q6H PRN Patriciaann Clan E, PA-C      . loperamide (IMODIUM) capsule 2-4 mg  2-4 mg Oral PRN Laverle Hobby, PA-C      . LORazepam (ATIVAN) tablet 1 mg  1 mg Oral TID Sharma Covert, MD   1 mg at 08/26/17 3810   Followed by  . [START ON 08/27/2017] LORazepam (ATIVAN) tablet 1 mg  1 mg Oral BID Sharma Covert, MD       Followed by  . [START ON 08/28/2017] LORazepam  (ATIVAN) tablet 1 mg  1 mg Oral Daily Sharma Covert, MD      . magnesium hydroxide (MILK OF MAGNESIA) suspension 30 mL  30 mL Oral Daily PRN Laverle Hobby, PA-C      . methocarbamol (ROBAXIN) tablet 500 mg  500 mg Oral Q8H PRN Patriciaann Clan E, PA-C   500 mg at 08/25/17 2120  .  multivitamin with minerals tablet 1 tablet  1 tablet Oral Daily Sharma Covert, MD   1 tablet at 08/26/17 0908  . naproxen (NAPROSYN) tablet 500 mg  500 mg Oral BID PRN Laverle Hobby, PA-C      . nicotine (NICODERM CQ - dosed in mg/24 hours) patch 21 mg  21 mg Transdermal Daily Cobos, Myer Peer, MD   21 mg at 08/26/17 0908  . ondansetron (ZOFRAN-ODT) disintegrating tablet 4 mg  4 mg Oral Q6H PRN Laverle Hobby, PA-C   4 mg at 08/24/17 2321  . pneumococcal 23 valent vaccine (PNU-IMMUNE) injection 0.5 mL  0.5 mL Intramuscular Tomorrow-1000 Cobos, Fernando A, MD      . thiamine (VITAMIN B-1) tablet 100 mg  100 mg Oral Daily Sharma Covert, MD   100 mg at 08/26/17 0909  . traZODone (DESYREL) tablet 100 mg  100 mg Oral QHS,MR X 1 Laverle Hobby, PA-C   100 mg at 08/25/17 2218  . venlafaxine XR (EFFEXOR-XR) 24 hr capsule 37.5 mg  37.5 mg Oral Daily Money, Lowry Ram, FNP   37.5 mg at 08/26/17 1638   Lab Results:  Results for orders placed or performed during the hospital encounter of 08/24/17 (from the past 48 hour(s))  Comprehensive metabolic panel     Status: None   Collection Time: 08/24/17  5:10 PM  Result Value Ref Range   Sodium 135 135 - 145 mmol/L   Potassium 4.0 3.5 - 5.1 mmol/L   Chloride 101 98 - 111 mmol/L    Comment: Please note change in reference range.   CO2 27 22 - 32 mmol/L   Glucose, Bld 95 70 - 99 mg/dL    Comment: Please note change in reference range.   BUN 17 6 - 20 mg/dL    Comment: Please note change in reference range.   Creatinine, Ser 1.21 0.61 - 1.24 mg/dL   Calcium 8.9 8.9 - 10.3 mg/dL   Total Protein 7.4 6.5 - 8.1 g/dL   Albumin 4.0 3.5 - 5.0 g/dL   AST 16 15 - 41  U/L   ALT 14 0 - 44 U/L    Comment: Please note change in reference range.   Alkaline Phosphatase 71 38 - 126 U/L   Total Bilirubin 0.7 0.3 - 1.2 mg/dL   GFR calc non Af Amer >60 >60 mL/min   GFR calc Af Amer >60 >60 mL/min    Comment: (NOTE) The eGFR has been calculated using the CKD EPI equation. This calculation has not been validated in all clinical situations. eGFR's persistently <60 mL/min signify possible Chronic Kidney Disease.    Anion gap 7 5 - 15    Comment: Performed at [facility], [address], [place], [place] [address]  Ethanol     Status: None   Collection Time: 08/24/17  5:10 PM  Result Value Ref Range   Alcohol, Ethyl (B) <10 <10 mg/dL    Comment: (NOTE) Lowest detectable limit for serum alcohol is 10 mg/dL. For medical purposes only. Performed at [facility], [address] [place], [place] [address]   CBC with Diff     Status: None   Collection Time: 08/24/17  5:10 PM  Result Value Ref Range   WBC 7.6 4.0 - 10.5 K/uL   RBC 4.39 4.22 - 5.81 MIL/uL   Hemoglobin 13.9 13.0 - 17.0 g/dL   HCT 40.8 39.0 - 52.0 %   MCV 92.9 78.0 - 100.0 fL   MCH  31.7 26.0 - 34.0 pg   MCHC 34.1 30.0 - 36.0 g/dL   RDW 12.4 11.5 - 15.5 %   Platelets 261 150 - 400 K/uL   Neutrophils Relative % 74 %   Neutro Abs 5.6 1.7 - 7.7 K/uL   Lymphocytes Relative 18 %   Lymphs Abs 1.4 0.7 - 4.0 K/uL   Monocytes Relative 5 %   Monocytes Absolute 0.4 0.1 - 1.0 K/uL   Eosinophils Relative 2 %   Eosinophils Absolute 0.1 0.0 - 0.7 K/uL   Basophils Relative 1 %   Basophils Absolute 0.1 0.0 - 0.1 K/uL    Comment: Performed at [facility], [address] [place], [place] [address]  Urine rapid drug screen (hosp performed)     Status: Abnormal   Collection Time: 08/24/17  8:20 PM  Result Value Ref Range   Opiates NONE DETECTED NONE DETECTED   Cocaine POSITIVE (A) NONE DETECTED   Benzodiazepines POSITIVE (A) NONE DETECTED   Amphetamines NONE DETECTED NONE DETECTED   Tetrahydrocannabinol  POSITIVE (A) NONE DETECTED   Barbiturates (A) NONE DETECTED    Result not available. Reagent lot number recalled by manufacturer.    Comment: (NOTE) DRUG SCREEN FOR MEDICAL PURPOSES ONLY.  IF CONFIRMATION IS NEEDED FOR ANY PURPOSE, NOTIFY LAB WITHIN 5 DAYS. LOWEST DETECTABLE LIMITS FOR URINE DRUG SCREEN Drug Class                     Cutoff (ng/mL) Amphetamine and metabolites    1000 Barbiturate and metabolites    200 Benzodiazepine                 938 Tricyclics and metabolites     300 Opiates and metabolites        300 Cocaine and metabolites        300 THC                            50 Performed at [facility], [address] [place], [place] [address]     Blood Alcohol level:  Lab Results  Component Value Date   ETH <10 11/29/8525    Metabolic Disorder Labs: No results found for: HGBA1C, MPG No results found for: PROLACTIN No results found for: CHOL, TRIG, HDL, CHOLHDL, VLDL, LDLCALC  Physical Findings: AIMS: Facial and Oral Movements Muscles of Facial Expression: None, normal Lips and Perioral Area: None, normal Jaw: None, normal Tongue: None, normal,Extremity Movements Upper (arms, wrists, hands, fingers): None, normal Lower (legs, knees, ankles, toes): None, normal, Trunk Movements Neck, shoulders, hips: None, normal, Overall Severity Severity of abnormal movements (highest score from questions above): None, normal Incapacitation due to abnormal movements: None, normal Patient's awareness of abnormal movements (rate only patient's report): No Awareness, Dental Status Current problems with teeth and/or dentures?: No Does patient usually wear dentures?: No  CIWA:  CIWA-Ar Total: 0 COWS:  COWS Total Score: 0  Musculoskeletal: Strength & Muscle Tone: within normal limits Gait & Station: normal Patient leans: N/A  Psychiatric Specialty Exam: Physical Exam  ROS  Blood pressure 105/75, pulse 80, temperature 97.7 F (36.5 C), temperature source Oral, resp.  rate 16, height 6' 2"  (1.88 m), weight 84.4 kg (186 lb), SpO2 99 %.Body mass index is 23.88 kg/m.  General Appearance: Disheveled  Eye Contact:  Fair  Speech:  Normal Rate  Volume:  Decreased  Mood:  Anxious and Depressed  Affect:  Congruent  Thought Process:  Coherent  Orientation:  Full (Time, Place, and Person)  Thought Content:  Logical  Suicidal Thoughts:  Yes.  without intent/plan  Homicidal Thoughts:  No  Memory:  Immediate;   Fair Recent;   Fair Remote;   Fair  Judgement:  Intact  Insight:  Fair  Psychomotor Activity:  Increased  Concentration:  Concentration: Fair and Attention Span: Fair  Recall:  [employer] of Knowledge:  Fair  Language:  Fair  Akathisia:  Negative  Handed:  Right  AIMS (if indicated):     Assets:  Communication Skills Desire for Improvement Housing Intimacy Physical Health Resilience  ADL's:  Intact  Cognition:  WNL  Sleep:  Number of Hours: 4.75     Treatment Plan Summary: Daily contact with patient to assess and evaluate symptoms and progress in treatment.  - Continue inpatient hospitalization.  - Will continue today 08/26/2017 plan as below except where it is noted.  Opioid/Benzodiazepine withdrawal symptom.     - Continue the Librium 25 mg po Q 6 hrs prn.     - Continue Clonidine detox protocols as already in progress.  Depression.     - Continue Effexor XR 37.5 mg po daily.  Nicotine withdrawal symptoms.     - Continue Nicoderm patch 21 mg topically Q 24 hours.  Insomnia.     - Continue Trazodone 100 mg po prn Q hs, may repeat x 1. Patient to continue attend & participate in the group sessions. Discharge disposition plan is ongoing.  Lindell Spar, NP, PMHNP, FNP-BC. 08/26/2017, 12:00 PM

## 2017-08-26 NOTE — Progress Notes (Signed)
Recreation Therapy Notes  Date: 7.22.19 Time: 0930 Location: [address] Dayroom  Group Topic: Stress Management  Goal Area(s) Addresses:  Patient will verbalize importance of using healthy stress management.  Patient will identify positive emotions associated with healthy stress management.   Behavioral Response: Engaged  Intervention: Stress Management  Activity :  Guided Imagery.  LRT introduced the stress management technique of guided imagery.  LRT read a script that allowed patients to envision floating on a cloud.  Patients were to listen and follow along as LRT read script to engage in the activity.  Education:  Stress Management, Discharge Planning.   Education Outcome: Acknowledges edcuation/In group clarification offered/Needs additional education  Clinical Observations/Feedback: Pt attended and participated in group.     Caroll RancherMarjette Sila Sarsfield, LRT/CTRS        Caroll RancherLindsay, Georgette Helmer A 08/26/2017 1:39 PM

## 2017-08-27 MED ORDER — NICOTINE 21 MG/24HR TD PT24: 21.0000 mg | MEDICATED_PATCH | Freq: Every day | TRANSDERMAL | 0 refills | Status: AC

## 2017-08-27 MED ORDER — HYDROXYZINE HCL 25 MG PO TABS: 25.0000 mg | ORAL_TABLET | Freq: Four times a day (QID) | ORAL | 0 refills | Status: AC | PRN

## 2017-08-27 MED ORDER — TRAZODONE HCL 100 MG PO TABS: 100.0000 mg | ORAL_TABLET | Freq: Every evening | ORAL | 0 refills | Status: AC | PRN

## 2017-08-27 MED ORDER — VENLAFAXINE HCL ER 37.5 MG PO CP24: 37.5000 mg | ORAL_CAPSULE | Freq: Every day | ORAL | 0 refills | Status: AC

## 2017-08-27 MED ORDER — TRAZODONE HCL 100 MG PO TABS
100.0000 mg | ORAL_TABLET | Freq: Every evening | ORAL | Status: DC | PRN
  Filled 2017-08-27: qty 7

## 2017-08-27 NOTE — Progress Notes (Signed)
  [facility] :  Will you be returning to the same living situation after discharge:  Yes,  home At discharge, do you have transportation home?: Yes,  family member Do you have the ability to pay for your medications: Yes,  mental health  Release of information consent forms completed and submitted to medical records by CSW.  Patient to Follow up at: Follow-up Information    [facility] Follow up on 08/29/2017.   Why:  Hospital follow-up on Thursday, 7/25 at 9:00AM. Please bring: photo ID, social security card, any proof of income if you have it, and hospital discharge paperwork. Thank you.  Contact information: [address] [place] [id number] [phone]           Next level [facility] care provider [facility]:no  Safety Planning and Suicide Prevention discussed: Yes,  SPE completed with pt; pt declined to consent to collateral contact. SPI pamphlet and Mobile Crisis information provided to pt.   Have you used any form of tobacco in the last 30 days? (Cigarettes, Smokeless Tobacco, Cigars, and/or Pipes): Yes  Has patient been referred to the Quitline?: Patient refused referral  Patient has been referred for addiction treatment: Yes  Rona RavensHeather S Ayona Yniguez, LCSW 08/27/2017, 9:07 AM

## 2017-08-27 NOTE — Progress Notes (Signed)
Patient ID: Jack FearDavid L Feig, male   DOB: 09/12/1995, 22 y.o.   MRN: [id number] Patient discharged to home/self care.  Patient denies SI, HI and AVH upon discharge.  Patient acknowledged understanding of all discharge instructions and receipt of all personal belongings.  Patient acknowledged the need for continued treatment in the community setting.

## 2017-08-27 NOTE — Progress Notes (Signed)
Adult Psychoeducational Group Note  Date:  08/27/2017 Time:  9:00 AM  Group Topic/Focus:  Goals Group:   The focus of this group is to help patients establish daily goals to achieve during treatment and discuss how the patient can incorporate goal setting into their daily lives to aide in recovery.  Participation Level:  Active  Participation Quality:  Appropriate  Affect:  Appropriate  Cognitive:  Appropriate  Insight: Appropriate and Good  Engagement in Group:  Improving  Modes of Intervention:  Activity and Discussion  Additional Comments:  Pt attended orientation/ goals group this morning and participated. Pt goal today is to prepare for discharge. Pt [facility]. Pt rated his day 8/10. Pt  Pt denies SI/HI at this time. Pt was pleasant and appropriate in group.   [name] 08/27/2017, 9:00 AM

## 2017-08-27 NOTE — Discharge Summary (Addendum)
Physician Discharge Summary Note  Patient:  Jack Mason is an 22 y.o., male  MRN:  [id number]  DOB:  1995-11-23  Patient phone:  [phone] (home)   Patient address:   [address] [place] [address],   Total Time spent with patient: Greater than 30 minutes  Date of Admission:  08/24/2017 Date of Discharge: 08-27-17  Reason for Admission: Suicidal threats vis a text to overdose.  Principal Problem: MDD (major depressive disorder), severe [facility](HCC)  Discharge Diagnoses: Patient Active Problem List   Diagnosis Date Noted  . MDD (major depressive disorder), severe (HCC) [F32.2] 08/25/2017  . 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 Cocaine abuse (HCC) [F14.10] 08/25/2017  . Substance induced mood disorder (HCC) [F19.94] 08/24/2017  . Patellar dislocation [S83.006A] 06/14/2011  . Fibula fracture [S82.409A] 10/16/2010   Past Psychiatric History: Mdd  Past Medical History:  Past Medical History:  Diagnosis Date  . ADHD   . Anxiety   . Depression   . Restless legs syndrome (RLS)     Past Surgical History:  Procedure Laterality Date  . REPLACEMENT TOTAL KNEE    . TESTICLE SURGERY     Family History:  Family History  Problem Relation Age of Onset  . Heart disease Unknown   . Cancer Unknown   . Diabetes Unknown    Family Psychiatric  History: See H&P Social History:  Social History   Substance and Sexual Activity  Alcohol Use No     Social History   Substance and Sexual Activity  Drug Use Yes  . Types: Marijuana, IV, Methamphetamines, Benzodiazepines, Heroin   Comment: xanax    Social History   Socioeconomic History  . Marital status: Single    Spouse name: Not on file  . Number of children: Not on file  . Years of education: Not on file  . Highest education level: Not on file  Occupational History  . Occupation: Consulting civil engineerstudent  Social Needs  . Financial resource strain: Not on file  . Food insecurity:    Worry: Not on file    Inability:  Not on file  . Transportation needs:    Medical: Not on file    Non-medical: Not on file  Tobacco Use  . Smoking status: Current Every Day Smoker    Packs/day: 1.50    Types: Cigarettes  . Smokeless tobacco: Never Used  Substance and Sexual Activity  . Alcohol use: No  . Drug use: Yes    Types: Marijuana, IV, Methamphetamines, Benzodiazepines, Heroin    Comment: xanax  . Sexual activity: Yes    Birth control/protection: None  Lifestyle  . Physical activity:    Days per week: Not on file    Minutes per session: Not on file  . Stress: Not on file  Relationships  . Social connections:    Talks on phone: Not on file    Gets together: Not on file    Attends religious service: Not on file    Active member of club or organization: Not on file    Attends meetings of clubs or organizations: Not on file    Relationship status: Not on file  Other Topics Concern  . Not on file  Social History Narrative  . Not on file   Hospital Course: (Per Md's admission SRA):  Patient is a 22 year old male who presented to [facility] emergency department on 08/24/2017 for assistance with his substance abuse issues.  He stated that he has been trying to get help  for his addiction.  He stated he had been unable to get treatment because he has no insurance.  He had been frustrated over this, and texted his family that he was thinking about overdosing on drugs.  Patient stated he was tired of living his way and that he wanted to change his life. Patient recently admitted to 3 DUIs in 1 week because he was driving around intoxicated on drugs.  He stated that he had gotten hooked on opiates after being treated for orthopedic injuries.  He also admitted that he had used Xanax, cocaine and marijuana.   He denied any previous detox or rehabilitation hospitalizations.  He also admitted to depression symptoms.  He was admitted to the hospital for evaluation and stabilization.  Upon his admission to this hospital,  it was noted that Jack Mason  was using multiple substance prior to this admission. His UDS on admission was positive for Benzodiazepine, Cocaine & THC. 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 He was also drug intoxicated requiring detoxification treatments. Afetr evaluation of his presenting symptoms, Jack Mason received clonidine detoxification treatment protocols to re-stabilize his systems of Opioid/other drug intoxications. He was also enrolled in group the counseling sessions being offered & held on this unit to learn coping skills that should help him after discharge to cope better and manage his substance abuse issues to maintain sobriety.   Besides the detoxification treatments, Jack Mason also received other medication regimen for his other depressive mood symptoms. He was medicated & discharged on; Effexor XR 37.5 mg for depression, Hydroxyzine 25 mg prn for anxiety, Nicotine patch 21 mg for smoking cessation & Trazodone 100 mg prn for insomnia. He presented no other significant pre-existing health issues that required treatment. However, he was monitored closely for any potential problems that may arise as a result of his current treatment regimen. He tolerated his treatment regimen without any significant adverse effects and or reactions reported.   Jack Mason. He is currently being discharged to continue routine psychiatric care & medication management on an outpatient basis as noted below. He is provided with all the necessary information needed to make this appointments without problems.  Upon discharge, he adamantly denies any suicidal, homicidal ideations,delusional thought, auditory, visual hallucinations and or substance withdrawal symptoms. He received 7 days worth supply sample of his [facility] discharge medications.  He left [facility] in no apparent distress.  Physical Findings: AIMS: Facial and Oral Movements Muscles of Facial  Expression: None, normal Lips and Perioral Area: None, normal Jaw: None, normal Tongue: None, normal,Extremity Movements Upper (arms, wrists, hands, fingers): None, normal Lower (legs, knees, ankles, toes): None, normal, Trunk Movements Neck, shoulders, hips: None, normal, Overall Severity Severity of abnormal movements (highest score from questions above): None, normal Incapacitation due to abnormal movements: None, normal Patient's awareness of abnormal movements (rate only patient's report): No Awareness, Dental Status Current problems with teeth and/or dentures?: No Does patient usually wear dentures?: No  CIWA:  CIWA-Ar Total: 1 COWS:  COWS Total Score: 0  Musculoskeletal: Strength & Muscle Tone: within normal limits Gait & Station: normal Patient leans: N/A  Psychiatric Specialty Exam: Physical Exam  Constitutional: He appears well-developed.  HENT:  Head: Normocephalic.  Eyes: Pupils are equal, round, and reactive to light.  Neck: Normal range of motion.  Cardiovascular: Normal rate.  Respiratory: Effort normal.  GI: Soft.    Review of Systems  Constitutional: Negative.   HENT: Negative.   Eyes:  Negative.   Respiratory: Negative.   Cardiovascular: Negative.   Gastrointestinal: Negative.   Genitourinary: Negative.   Musculoskeletal: Negative.   Skin: Negative.   Neurological: Negative.   Endo/Heme/Allergies: Negative.   Psychiatric/Behavioral: Positive for depression (Mason) and substance abuse (Hx. opioid, Benzodiazepine, XCocaine & THC use disorders.). Negative for hallucinations, memory loss and suicidal ideas. The patient has insomnia (Mason). The patient is not nervous/anxious.     Blood pressure 111/80, pulse 75, temperature (!) 97.5 F (36.4 C), temperature source Oral, resp. rate 16, height 6\' 2"  (1.88 m), weight 84.4 kg (186 lb), SpO2 99 %.Body mass index is 23.88 kg/m.  See Md's SRA   Have you used any form of tobacco in the last 30 days?  (Cigarettes, Smokeless Tobacco, Cigars, and/or Pipes): Yes  Has this patient used any form of tobacco in the last 30 days? (Cigarettes, Smokeless Tobacco, Cigars, and/or Pipes):Yes, an FDA-approved tobacco cessation medication was offered at discharge.  Blood Alcohol level:  Lab Results  Component Value Date   ETH <10 08/24/2017   Metabolic Disorder Labs:  No results found for: HGBA1C, MPG No results found for: PROLACTIN No results found for: CHOL, TRIG, HDL, CHOLHDL, VLDL, LDLCALC  See Psychiatric Specialty Exam and Suicide Risk Assessment completed by Attending Physician prior to discharge.  Discharge destination:  Home  Is patient on multiple antipsychotic therapies at discharge:  No   Has Patient had three or more failed trials of antipsychotic monotherapy by history:  No  Recommended Plan for Multiple Antipsychotic Therapies: NA  Allergies as of 08/27/2017   No Known Allergies     Medication List    TAKE these medications     Indication  hydrOXYzine 25 MG tablet Commonly known as:  ATARAX/VISTARIL Take 1 tablet (25 mg total) by mouth every 6 (six) hours as needed for anxiety.  Indication:  Feeling Anxious   nicotine 21 mg/24hr patch Commonly known as:  NICODERM CQ - dosed in mg/24 hours Place 1 patch (21 mg total) onto the skin daily. (May buy from over the counter): For smoking cessation Start taking on:  08/28/2017  Indication:  Nicotine Addiction   traZODone 100 MG tablet Commonly known as:  DESYREL Take 1 tablet (100 mg total) by mouth at bedtime as needed for sleep.  Indication:  Trouble Sleeping   venlafaxine XR 37.5 MG 24 hr capsule Commonly known as:  EFFEXOR-XR Take 1 capsule (37.5 mg total) by mouth daily. For depression Start taking on:  08/28/2017  Indication:  Major Depressive Disorder      Follow-up Information    [facility] Follow up on 08/29/2017.   Why:  Hospital follow-up on Thursday, 7/25 at 9:00AM. Please bring: photo ID,  social security card, any proof of income if you have it, and hospital discharge paperwork. Thank you.  Contact information: [address] [place] [address] [phone]          Follow-up recommendations: Activity:  As tolerated Diet: As recommended by your primary care doctor. Keep all scheduled follow-up appointments as recommended.  Comments: Patient is instructed prior to discharge to: Take all medications as prescribed by his/her mental healthcare provider. Report any adverse effects and or reactions from the medicines to his/her outpatient provider promptly. Patient has been instructed & cautioned: To not engage in alcohol and or illegal drug use while on prescription medicines. In the event of worsening symptoms, patient is instructed to call the crisis hotline, 911 and or go to the nearest ED for  appropriate evaluation and treatment of symptoms. To follow-up with his/her primary care provider for your other medical issues, concerns and or health care needs.   Signed: Armandina Stammer, NP, PMHNP, FNP-BC 08/27/2017, 9:45 AM   Patient seen, Suicide Assessment Completed.  Disposition Plan Reviewed

## 2017-08-27 NOTE — [facility] Suicide Risk Assessment (Addendum)
[facility] Discharge Suicide Risk Assessment   Principal Problem: MDD (major depressive disorder), severe [facility]) Discharge Diagnoses:  Patient Active Problem List   Diagnosis Date Noted  . MDD (major depressive disorder), severe (HCC) [F32.2] 08/25/2017  . 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 Cocaine abuse (HCC) [F14.10] 08/25/2017  . Substance induced mood disorder (HCC) [F19.94] 08/24/2017  . Patellar dislocation [S83.006A] 06/14/2011  . Fibula fracture [S82.409A] 10/16/2010    Total Time spent with patient: 30 minutes  Musculoskeletal: Strength & Muscle Tone: within normal limits no tremors, no diaphoresis, no restlessness or agitation Gait & Station: normal Patient leans: N/A  Psychiatric Specialty Exam: ROS no headache, no chest pain, no shortness of breath, no vomiting , no diarrhea, no fever   Blood pressure 111/80, pulse 75, temperature (!) 97.5 F (36.4 C), temperature source Oral, resp. rate 16, height 6\' 2"  (1.88 m), weight 84.4 kg (186 lb), SpO2 99 %.Body mass index is 23.88 kg/m.  General Appearance: Well Groomed  Eye Contact::  Good  Speech:  Normal Rate409  Volume:  Normal  Mood:  reports feeling " a lot better", " back to normal"  Affect:  Appropriate  Thought Process:  Linear and Descriptions of Associations: Intact  Orientation:  Full (Time, Place, and Person)  Thought Content:  no hallucinations, no delusions, not internally preoccupied   Suicidal Thoughts:  No denies any suicidal or self injurious ideations, denies any homicidal or violent ideations  Homicidal Thoughts:  No  Memory:  recent and remote grossly intact   Judgement:  Other:  improving   Insight:  improving   Psychomotor Activity:  Normal  Concentration:  Good  Recall:  Good  Fund of Knowledge:Good  Language: Good  Akathisia:  Negative  Handed:  Right  AIMS (if indicated):     Assets:  Communication Skills Desire for Improvement Resilience  Sleep:  Number of Hours: 5   Cognition: WNL  ADL's:  Intact   Mental Status Per Nursing Assessment::   On Admission:  Suicidal ideation indicated by others, Suicide plan, Self-harm thoughts  Demographic Factors:  22 year old single, no children, was living with grandmother, employed .  Loss Factors: GF currently pregnant, recent DUI  Historical Factors: No prior psychiatric admissions, no history of suicide attempts, history of substance abuse, identifies opiates as substance of choice   Risk Reduction Factors:   Sense of responsibility to family, Employed, Living with another person, especially a relative and Positive coping skills or problem solving skills  Continued Clinical Symptoms:  At this time patient reports feeling significantly better, and denies feeling depressed. Affect presents reactive. No suicidal or self injurious ideations, no homicidal or violent ideations, no psychotic symptoms. No current significant WDL symptoms, does not appear in any acute distress or discomfort, and vitals are stable. Denies medication side effects. Side effects discussed .  Visible in milieu, pleasant on approach.   Cognitive Features That Contribute To Risk:  No gross cognitive deficits noted upon discharge. Is alert , attentive, and oriented x 3   Suicide Risk:  Mild:  Suicidal ideation of limited frequency, intensity, duration, and specificity.  There are no identifiable plans, no associated intent, mild dysphoria and related symptoms, good self-control (both objective and subjective assessment), few other risk factors, and identifiable protective factors, including available and accessible social support.  Follow-up Information    [facility] Follow up on 08/29/2017.   Why:  Hospital follow-up on Thursday, 7/25 at 9:00AM. Please bring: photo ID, social security  card, any proof of income if you have it, and hospital discharge paperwork. Thank you.  Contact information: [address] [place]  [id number] [phone]           Plan Of Care/Follow-up recommendations:  Activity:  as tolerated Diet:  regular Tests:  NA Other:  See below  Patient is expressing readiness for discharge, there are no current grounds for involuntary commitment  Plans to return to live with grandmother Follow up as above . Expresses interest in going to 12 step programs , NA.   Craige CottaFernando A Yancy Knoble, MD 08/27/2017, 9:40 AM

## 2017-08-27 NOTE — Plan of Care (Signed)
Patient verbalizes understanding of information, education provided. 

## 2017-08-27 NOTE — Progress Notes (Signed)
D: Patient observed up and visible in the milieu. Interacting appropriately with peers and staff. Patient states he is hopeful for discharge tomorrow and denies any withdrawal at this time. Stated he did not need his 2200 clonidine. Patient's affect animated, mood pleasant.  Denies pain, physical complaints.   A: Medicated per orders, no prns requested or required. Medication education provided. Level III obs in place for safety. Emotional support offered. Patient encouraged to complete Suicide Safety Plan before discharge. Encouraged to attend and participate in unit programming.   R: Patient verbalizes understanding of POC. Patient denies SI/HI/AVH and remains safe on level III obs. Will continue to monitor throughout the night.
